# Patient Record
Sex: Female | Born: 1985 | Race: White | Hispanic: No | Marital: Married | Smoking: Never smoker
Health system: Southern US, Community
[De-identification: ages and names within clinical notes are randomized; demographics above are authoritative.]

## PROBLEM LIST (undated history)

## (undated) ENCOUNTER — Inpatient Hospital Stay (HOSPITAL_COMMUNITY): Payer: Self-pay

## (undated) DIAGNOSIS — Z789 Other specified health status: Secondary | ICD-10-CM

## (undated) HISTORY — PX: THERAPEUTIC ABORTION: SHX798

---

## 2017-02-18 ENCOUNTER — Other Ambulatory Visit: Payer: Self-pay | Admitting: Gastroenterology

## 2017-02-18 DIAGNOSIS — R1030 Lower abdominal pain, unspecified: Secondary | ICD-10-CM

## 2017-02-22 ENCOUNTER — Ambulatory Visit
Admission: RE | Admit: 2017-02-22 | Discharge: 2017-02-22 | Disposition: A | Discharge status: Home / Self Care | Payer: 59 | Source: Ambulatory Visit | Attending: Gastroenterology | Admitting: Gastroenterology

## 2017-02-22 DIAGNOSIS — R1030 Lower abdominal pain, unspecified: Secondary | ICD-10-CM

## 2017-02-22 MED ORDER — IOPAMIDOL (ISOVUE-300) INJECTION 61%
100.0000 mL | Freq: Once | INTRAVENOUS | Status: AC | PRN
  Administered 2017-02-22: 100 mL via INTRAVENOUS

## 2017-03-07 ENCOUNTER — Encounter (HOSPITAL_COMMUNITY): Payer: Self-pay | Admitting: Emergency Medicine

## 2017-03-07 ENCOUNTER — Emergency Department (HOSPITAL_COMMUNITY)
Admission: EM | Admit: 2017-03-07 | Discharge: 2017-03-08 | Disposition: A | Discharge status: Home / Self Care | Payer: 59 | Attending: Emergency Medicine | Admitting: Emergency Medicine

## 2017-03-07 DIAGNOSIS — R102 Pelvic and perineal pain: Secondary | ICD-10-CM

## 2017-03-07 LAB — COMPREHENSIVE METABOLIC PANEL
ALBUMIN: 4.5 g/dL (ref 3.5–5.0)
ALT: 15 U/L (ref 14–54)
AST: 16 U/L (ref 15–41)
Alkaline Phosphatase: 51 U/L (ref 38–126)
Anion gap: 7 (ref 5–15)
BILIRUBIN TOTAL: 1 mg/dL (ref 0.3–1.2)
BUN: 11 mg/dL (ref 6–20)
CHLORIDE: 108 mmol/L (ref 101–111)
CO2: 26 mmol/L (ref 22–32)
CREATININE: 0.8 mg/dL (ref 0.44–1.00)
Calcium: 9.6 mg/dL (ref 8.9–10.3)
GFR calc Af Amer: >60 mL/min (ref >60)
GLUCOSE: 85 mg/dL (ref 65–99)
POTASSIUM: 3.8 mmol/L (ref 3.5–5.1)
Sodium: 141 mmol/L (ref 135–145)
TOTAL PROTEIN: 7.8 g/dL (ref 6.5–8.1)

## 2017-03-07 LAB — URINALYSIS, ROUTINE W REFLEX MICROSCOPIC
BACTERIA UA: NONE SEEN
Bilirubin Urine: NEGATIVE
Glucose, UA: NEGATIVE mg/dL
Ketones, ur: NEGATIVE mg/dL
Leukocytes, UA: NEGATIVE
Nitrite: NEGATIVE
PH: 5 (ref 5.0–8.0)
Protein, ur: NEGATIVE mg/dL
SPECIFIC GRAVITY, URINE: 1.013 (ref 1.005–1.030)

## 2017-03-07 LAB — I-STAT BETA HCG BLOOD, ED (MC, WL, AP ONLY)

## 2017-03-07 LAB — CBC
HCT: 41.6 % (ref 36.0–46.0)
Hemoglobin: 13.8 g/dL (ref 12.0–15.0)
MCH: 32 pg (ref 26.0–34.0)
MCHC: 33.2 g/dL (ref 30.0–36.0)
MCV: 96.5 fL (ref 78.0–100.0)
PLATELETS: 286 10*3/uL (ref 150–400)
RBC: 4.31 MIL/uL (ref 3.87–5.11)
RDW: 12.4 % (ref 11.5–15.5)
WBC: 8.7 10*3/uL (ref 4.0–10.5)

## 2017-03-07 LAB — LIPASE, BLOOD: Lipase: 15 U/L (ref 11–51)

## 2017-03-07 MED ORDER — OXYCODONE-ACETAMINOPHEN 5-325 MG PO TABS
1.0000 | ORAL_TABLET | Freq: Once | ORAL | Status: AC
  Administered 2017-03-07: 1 via ORAL
  Filled 2017-03-07: qty 1

## 2017-03-07 MED ORDER — IBUPROFEN 200 MG PO TABS
600.0000 mg | ORAL_TABLET | Freq: Once | ORAL | Status: AC
  Administered 2017-03-07: 600 mg via ORAL
  Filled 2017-03-07: qty 3

## 2017-03-07 NOTE — ED Provider Notes (Signed)
[facility]-EMERGENCY DEPT Provider Note   CSN: [id number] Arrival date & time: 03/07/17  4132     History   Chief Complaint Chief Complaint  Patient presents with  . Pelvic Pain    HPI Kendra Romero is a 31 y.o. female.  Patient with no significant medical history presents with mild pelvic pain for the past 2-3 weeks, becoming severe yesterday. The pain is constant. She reports having an elective abortion 3 months ago that was without complication. She completed the antibiotic course she was prescribed. She denies fever, nausea, vomiting, vaginal bleeding or discharge, dysuria, diarrhea or constipation. She has not had a regular menstrual cycle since the procedure.    The history is provided by the patient. No language interpreter was used.    History reviewed. No pertinent past medical history.  There are no active problems to display for this patient.   Past Surgical History:  Procedure Laterality Date  . THERAPEUTIC ABORTION      OB History    No data available       Home Medications    Prior to Admission medications   Not on File    Family History No family history on file.  Social History Social History  Substance Use Topics  . Smoking status: Never Smoker  . Smokeless tobacco: Never Used  . Alcohol use No     Allergies   Patient has no known allergies.   Review of Systems Review of Systems  Constitutional: Negative for chills and fever.  Respiratory: Negative.   Cardiovascular: Negative.   Gastrointestinal: Negative.   Genitourinary: Positive for pelvic pain. Negative for dysuria, vaginal bleeding and vaginal discharge.  Musculoskeletal: Negative.   Skin: Negative.   Neurological: Negative.  Negative for syncope and weakness.     Physical Exam Updated Vital Signs BP 112/77 (BP Location: Left Arm)   Pulse 84   Temp 98.6 F (37 C) (Oral)   Resp 20   SpO2 100%   Physical Exam  Constitutional: She is oriented to person, place, and  time. She appears well-developed and well-nourished.  Neck: Normal range of motion.  Pulmonary/Chest: Effort normal.  Genitourinary:  Genitourinary Comments: Mucous discharge from the cervix. No bleeding. Cervix is not friable. There is bilateral uterine and adnexal tenderness without mass.   Neurological: She is alert and oriented to person, place, and time.  Skin: Skin is warm and dry.     ED Treatments / Results  Labs (all labs ordered are listed, but only abnormal results are displayed) Labs Reviewed  URINALYSIS, ROUTINE W REFLEX MICROSCOPIC - Abnormal; Notable for the following:       Result Value   Hgb urine dipstick SMALL (*)    Squamous Epithelial / LPF 0-5 (*)    All other components within normal limits  LIPASE, BLOOD  COMPREHENSIVE METABOLIC PANEL  CBC  I-STAT BETA HCG BLOOD, ED ([facility], [facility], [facility] ONLY)   Results for orders placed or performed during the hospital encounter of 03/07/17  Wet prep, genital  Result Value Ref Range   Yeast Wet Prep HPF POC NONE SEEN NONE SEEN   Trich, Wet Prep NONE SEEN NONE SEEN   Clue Cells Wet Prep HPF POC NONE SEEN NONE SEEN   WBC, Wet Prep HPF POC RARE (A) NONE SEEN   Sperm NONE SEEN   Lipase, blood  Result Value Ref Range   Lipase 15 11 - 51 U/L  Comprehensive metabolic panel  Result Value Ref Range   Sodium 141  135 - 145 mmol/L   Potassium 3.8 3.5 - 5.1 mmol/L   Chloride 108 101 - 111 mmol/L   CO2 26 22 - 32 mmol/L   Glucose, Bld 85 65 - 99 mg/dL   BUN 11 6 - 20 mg/dL   Creatinine, Ser 4.09 0.44 - 1.00 mg/dL   Calcium 9.6 8.9 - 81.1 mg/dL   Total Protein 7.8 6.5 - 8.1 g/dL   Albumin 4.5 3.5 - 5.0 g/dL   AST 16 15 - 41 U/L   ALT 15 14 - 54 U/L   Alkaline Phosphatase 51 38 - 126 U/L   Total Bilirubin 1.0 0.3 - 1.2 mg/dL   GFR calc non Af Amer >60 >60 mL/min   GFR calc Af Amer >60 >60 mL/min   Anion gap 7 5 - 15  CBC  Result Value Ref Range   WBC 8.7 4.0 - 10.5 K/uL   RBC 4.31 3.87 - 5.11 MIL/uL   Hemoglobin 13.8 12.0 -  15.0 g/dL   HCT 91.4 78.2 - 95.6 %   MCV 96.5 78.0 - 100.0 fL   MCH 32.0 26.0 - 34.0 pg   MCHC 33.2 30.0 - 36.0 g/dL   RDW 21.3 08.6 - 57.8 %   Platelets 286 150 - 400 K/uL  Urinalysis, Routine w reflex microscopic  Result Value Ref Range   Color, Urine YELLOW YELLOW   APPearance CLEAR CLEAR   Specific Gravity, Urine 1.013 1.005 - 1.030   pH 5.0 5.0 - 8.0   Glucose, UA NEGATIVE NEGATIVE mg/dL   Hgb urine dipstick SMALL (A) NEGATIVE   Bilirubin Urine NEGATIVE NEGATIVE   Ketones, ur NEGATIVE NEGATIVE mg/dL   Protein, ur NEGATIVE NEGATIVE mg/dL   Nitrite NEGATIVE NEGATIVE   Leukocytes, UA NEGATIVE NEGATIVE   RBC / HPF 0-5 0 - 5 RBC/hpf   WBC, UA 0-5 0 - 5 WBC/hpf   Bacteria, UA NONE SEEN NONE SEEN   Squamous Epithelial / LPF 0-5 (A) NONE SEEN   Mucus PRESENT   I-Stat beta hCG blood, ED  Result Value Ref Range   I-stat hCG, quantitative <5.0 <5 mIU/mL   Comment 3            EKG  EKG Interpretation None       Radiology No results found. US Pelvis Transvanginal Non-ob (tv Only)  Result Date: 03/08/2017 CLINICAL DATA:  Pelvic pain since 4 p.m.  Abortion 3 months ago. EXAM: TRANSABDOMINAL AND TRANSVAGINAL ULTRASOUND OF PELVIS DOPPLER ULTRASOUND OF OVARIES TECHNIQUE: Both transabdominal and transvaginal ultrasound examinations of the pelvis were performed. Transabdominal technique was performed for global imaging of the pelvis including uterus, ovaries, adnexal regions, and pelvic cul-de-sac. It was necessary to proceed with endovaginal exam following the transabdominal exam to visualize the ovaries and endometrium. Color and duplex Doppler ultrasound was utilized to evaluate blood flow to the ovaries. COMPARISON:  CT abdomen and pelvis 02/22/2017 FINDINGS: Uterus Measurements: 9.7 x 5.4 x 5.8 cm. No fibroids or other mass visualized. Endometrium Thickness: 10.9 mm. Diffuse thickening of the endometrium. Heterogeneous hyperechoic material throughout the endometrium and extending into  the endocervical canal. No flow is demonstrated in the endometrial contents on color flow Doppler imaging. This suggest most likely diagnosis of blood clots. Retained products of conception less likely. No focal lesions identified. Right ovary Measurements: 3.2 x 1.6 x 2.2 cm. Normal appearance/no adnexal mass. Left ovary Measurements: 3.2 x 1.6 x 2.1 cm. Normal appearance/no adnexal mass. Pulsed Doppler evaluation of both ovaries demonstrates normal  low-resistance arterial and venous waveforms. Other findings No abnormal free fluid. IMPRESSION: Diffusely thickened endometrium filled with hyperechoic material. No flow demonstrated. This most likely represents blood clots. Retained products of conception less likely. Gynecology consultation suggested. Follow-up as indicated. Electronically Signed   By: Burman NievesWilliam  Stevens M.D.   On: 03/08/2017 04:59   [place] Pelvis (transabdominal Only)  Result Date: 03/08/2017 CLINICAL DATA:  Pelvic pain since 4 p.m.  Abortion 3 months ago. EXAM: TRANSABDOMINAL AND TRANSVAGINAL ULTRASOUND OF PELVIS DOPPLER ULTRASOUND OF OVARIES TECHNIQUE: Both transabdominal and transvaginal ultrasound examinations of the pelvis were performed. Transabdominal technique was performed for global imaging of the pelvis including uterus, ovaries, adnexal regions, and pelvic cul-de-sac. It was necessary to proceed with endovaginal exam following the transabdominal exam to visualize the ovaries and endometrium. Color and duplex Doppler ultrasound was utilized to evaluate blood flow to the ovaries. COMPARISON:  CT abdomen and pelvis 02/22/2017 FINDINGS: Uterus Measurements: 9.7 x 5.4 x 5.8 cm. No fibroids or other mass visualized. Endometrium Thickness: 10.9 mm. Diffuse thickening of the endometrium. Heterogeneous hyperechoic material throughout the endometrium and extending into the endocervical canal. No flow is demonstrated in the endometrial contents on color flow Doppler imaging. This suggest most likely  diagnosis of blood clots. Retained products of conception less likely. No focal lesions identified. Right ovary Measurements: 3.2 x 1.6 x 2.2 cm. Normal appearance/no adnexal mass. Left ovary Measurements: 3.2 x 1.6 x 2.1 cm. Normal appearance/no adnexal mass. Pulsed Doppler evaluation of both ovaries demonstrates normal low-resistance arterial and venous waveforms. Other findings No abnormal free fluid. IMPRESSION: Diffusely thickened endometrium filled with hyperechoic material. No flow demonstrated. This most likely represents blood clots. Retained products of conception less likely. Gynecology consultation suggested. Follow-up as indicated. Electronically Signed   By: Burman NievesWilliam  Stevens M.D.   On: 03/08/2017 04:59   Ct Abdomen Pelvis W Contrast  Result Date: 02/22/2017 CLINICAL DATA:  31 year old female with lower abdominal and right upper quadrant pain. Diarrhea. Blood in stool. EXAM: CT ABDOMEN AND PELVIS WITH CONTRAST TECHNIQUE: Multidetector CT imaging of the abdomen and pelvis was performed using the standard protocol following bolus administration of intravenous contrast. CONTRAST:  100mL ISOVUE-300 IOPAMIDOL (ISOVUE-300) INJECTION 61% COMPARISON:  None. FINDINGS: Lower chest: No acute abnormality. Hepatobiliary: No focal liver abnormality is seen. No gallstones, gallbladder wall thickening, or biliary dilatation. Pancreas: Unremarkable. No pancreatic ductal dilatation or surrounding inflammatory changes. Spleen: Normal in size without focal abnormality. Adrenals/Urinary Tract: Adrenal glands are unremarkable. Kidneys are normal, without renal calculi, focal lesion, or hydronephrosis. Bladder is unremarkable. Stomach/Bowel: Stomach is within normal limits. Appendix appears normal. No evidence of bowel wall thickening, distention, or inflammatory changes. Vascular/Lymphatic: No significant vascular findings are present. No enlarged abdominal or pelvic lymph nodes. Reproductive: Uterus and bilateral adnexa  are unremarkable. Other: No abdominal wall hernia or abnormality. No abdominopelvic ascites. Musculoskeletal: No acute or significant osseous findings. IMPRESSION: Unremarkable CT evaluation of the abdomen and pelvis. Electronically Signed   By: Sande BrothersSerena  Chacko M.D.   On: 02/22/2017 11:41   [place] Pelvic Doppler (torsion R/o Or Mass Arterial Flow)  Result Date: 03/08/2017 CLINICAL DATA:  Pelvic pain since 4 p.m.  Abortion 3 months ago. EXAM: TRANSABDOMINAL AND TRANSVAGINAL ULTRASOUND OF PELVIS DOPPLER ULTRASOUND OF OVARIES TECHNIQUE: Both transabdominal and transvaginal ultrasound examinations of the pelvis were performed. Transabdominal technique was performed for global imaging of the pelvis including uterus, ovaries, adnexal regions, and pelvic cul-de-sac. It was necessary to proceed with endovaginal exam following the transabdominal exam to  visualize the ovaries and endometrium. Color and duplex Doppler ultrasound was utilized to evaluate blood flow to the ovaries. COMPARISON:  CT abdomen and pelvis 02/22/2017 FINDINGS: Uterus Measurements: 9.7 x 5.4 x 5.8 cm. No fibroids or other mass visualized. Endometrium Thickness: 10.9 mm. Diffuse thickening of the endometrium. Heterogeneous hyperechoic material throughout the endometrium and extending into the endocervical canal. No flow is demonstrated in the endometrial contents on color flow Doppler imaging. This suggest most likely diagnosis of blood clots. Retained products of conception less likely. No focal lesions identified. Right ovary Measurements: 3.2 x 1.6 x 2.2 cm. Normal appearance/no adnexal mass. Left ovary Measurements: 3.2 x 1.6 x 2.1 cm. Normal appearance/no adnexal mass. Pulsed Doppler evaluation of both ovaries demonstrates normal low-resistance arterial and venous waveforms. Other findings No abnormal free fluid. IMPRESSION: Diffusely thickened endometrium filled with hyperechoic material. No flow demonstrated. This most likely represents blood  clots. Retained products of conception less likely. Gynecology consultation suggested. Follow-up as indicated. Electronically Signed   By: Burman Nieves M.D.   On: 03/08/2017 04:59    Procedures Procedures (including critical care time)  Medications Ordered in ED Medications - No data to display   Initial Impression / Assessment and Plan / ED Course  I have reviewed the triage vital signs and the nursing notes.  Pertinent labs & imaging results that were available during my care of the patient were reviewed by me and considered in my medical decision making (see chart for details).     Patient presents with lower abdominal/pelvic pain ongoing for 2-3 weeks, worse yesterday - no fever.   Negative pregnancy test, no leukocytosis or anemia. Exam shows generalized tenderness through the pelvis. Pain somewhat hard to manage. US performed which shows thickened endometrium c/w blood clot, retained products less likely.   Discussed with Dr. Viviann Spare (GYN) who advised patient is fine to go home and suggested starting on Provera. Will refer for routine GYN care.   Final Clinical Impressions(s) / ED Diagnoses   Final diagnoses:  None   1. Pelvic pain   New Prescriptions New Prescriptions   No medications on file     Elpidio Anis, Cordelia Poche 03/08/17 1610    Shon Baton, MD 03/09/17 2253

## 2017-03-07 NOTE — ED Notes (Signed)
Pt reports having pelvic pain since 0400 and has worsened. Pt states she has had pink colored vaginal bleeding since then.

## 2017-03-07 NOTE — ED Triage Notes (Signed)
Pt comes in with complaints of pelvic/uterine pain.  Denies hx of ovarian cyst. Reports having an abortion 3 months ago.  States she has been taking ibuprofen without relief.  Has not had period since abortion.  States she has a clear discharge.  Denies nausea and vomiting or diarrhea.

## 2017-03-08 ENCOUNTER — Emergency Department (HOSPITAL_COMMUNITY): Payer: 59

## 2017-03-08 LAB — WET PREP, GENITAL
CLUE CELLS WET PREP: NONE SEEN
Sperm: NONE SEEN
TRICH WET PREP: NONE SEEN
Yeast Wet Prep HPF POC: NONE SEEN

## 2017-03-08 MED ORDER — HYDROCODONE-ACETAMINOPHEN 5-325 MG PO TABS: 1.0000 | ORAL_TABLET | ORAL | 0 refills | Status: DC | PRN

## 2017-03-08 MED ORDER — IBUPROFEN 600 MG PO TABS: 600.0000 mg | ORAL_TABLET | Freq: Four times a day (QID) | ORAL | 0 refills | Status: DC | PRN

## 2017-03-08 MED ORDER — OXYCODONE-ACETAMINOPHEN 5-325 MG PO TABS
1.0000 | ORAL_TABLET | Freq: Once | ORAL | Status: AC
  Administered 2017-03-08: 1 via ORAL
  Filled 2017-03-08: qty 1

## 2017-03-08 MED ORDER — MEDROXYPROGESTERONE ACETATE 5 MG PO TABS: 10.0000 mg | ORAL_TABLET | Freq: Every day | ORAL | 0 refills | Status: DC

## 2017-03-08 NOTE — ED Notes (Signed)
Ultrasound at bedside

## 2017-03-08 NOTE — ED Notes (Signed)
Awaiting ultrasound of pelvis

## 2017-03-08 NOTE — Discharge Instructions (Signed)
Take Provera once daily for the next 14 days. You will begin your period when this medication is completed within 3-4 days. You should start taking ibuprofen 600 mg 1-2 days after finishing the Provera to help with cramping pain ahead of time. [facility] for routine gynecologic care.

## 2017-03-09 LAB — GC/CHLAMYDIA PROBE AMP (~~LOC~~) NOT AT ARMC
CHLAMYDIA, DNA PROBE: NEGATIVE
NEISSERIA GONORRHEA: NEGATIVE

## 2017-05-31 ENCOUNTER — Encounter (HOSPITAL_COMMUNITY): Payer: Self-pay

## 2017-05-31 ENCOUNTER — Inpatient Hospital Stay (HOSPITAL_COMMUNITY)
Admission: AD | Admit: 2017-05-31 | Discharge: 2017-05-31 | Disposition: A | Discharge status: Home / Self Care | Payer: 59 | Source: Ambulatory Visit | Attending: Obstetrics and Gynecology | Admitting: Obstetrics and Gynecology

## 2017-05-31 DIAGNOSIS — O99281 Endocrine, nutritional and metabolic diseases complicating pregnancy, first trimester: Secondary | ICD-10-CM | POA: Insufficient documentation

## 2017-05-31 DIAGNOSIS — Z791 Long term (current) use of non-steroidal anti-inflammatories (NSAID): Secondary | ICD-10-CM | POA: Diagnosis not present

## 2017-05-31 DIAGNOSIS — Z79899 Other long term (current) drug therapy: Secondary | ICD-10-CM | POA: Diagnosis not present

## 2017-05-31 DIAGNOSIS — E860 Dehydration: Secondary | ICD-10-CM | POA: Diagnosis not present

## 2017-05-31 DIAGNOSIS — O219 Vomiting of pregnancy, unspecified: Secondary | ICD-10-CM | POA: Insufficient documentation

## 2017-05-31 DIAGNOSIS — O210 Mild hyperemesis gravidarum: Secondary | ICD-10-CM

## 2017-05-31 DIAGNOSIS — Z3A08 8 weeks gestation of pregnancy: Secondary | ICD-10-CM | POA: Diagnosis not present

## 2017-05-31 DIAGNOSIS — Z3491 Encounter for supervision of normal pregnancy, unspecified, first trimester: Secondary | ICD-10-CM

## 2017-05-31 LAB — URINALYSIS, ROUTINE W REFLEX MICROSCOPIC
BILIRUBIN URINE: NEGATIVE
Glucose, UA: NEGATIVE mg/dL
Hgb urine dipstick: NEGATIVE
KETONES UR: 80 mg/dL — AB
Nitrite: NEGATIVE
Protein, ur: 30 mg/dL — AB
Specific Gravity, Urine: 1.034 — ABNORMAL HIGH (ref 1.005–1.030)
pH: 5 (ref 5.0–8.0)

## 2017-05-31 LAB — COMPREHENSIVE METABOLIC PANEL
ALT: 20 U/L (ref 14–54)
ANION GAP: 7 (ref 5–15)
AST: 24 U/L (ref 15–41)
Albumin: 3.9 g/dL (ref 3.5–5.0)
Alkaline Phosphatase: 40 U/L (ref 38–126)
BUN: 14 mg/dL (ref 6–20)
CALCIUM: 9.4 mg/dL (ref 8.9–10.3)
CHLORIDE: 108 mmol/L (ref 101–111)
CO2: 24 mmol/L (ref 22–32)
Creatinine, Ser: 0.63 mg/dL (ref 0.44–1.00)
GFR calc non Af Amer: >60 mL/min (ref >60)
Glucose, Bld: 69 mg/dL (ref 65–99)
Potassium: 3.7 mmol/L (ref 3.5–5.1)
SODIUM: 139 mmol/L (ref 135–145)
Total Bilirubin: 0.8 mg/dL (ref 0.3–1.2)
Total Protein: 7.1 g/dL (ref 6.5–8.1)

## 2017-05-31 LAB — CBC WITH DIFFERENTIAL/PLATELET
BASOS ABS: 0 10*3/uL (ref 0.0–0.1)
BASOS PCT: 0 %
Eosinophils Absolute: 0 10*3/uL (ref 0.0–0.7)
Eosinophils Relative: 1 %
HCT: 40 % (ref 36.0–46.0)
HEMOGLOBIN: 13.7 g/dL (ref 12.0–15.0)
LYMPHS ABS: 1.8 10*3/uL (ref 0.7–4.0)
Lymphocytes Relative: 28 %
MCH: 32.4 pg (ref 26.0–34.0)
MCHC: 34.3 g/dL (ref 30.0–36.0)
MCV: 94.6 fL (ref 78.0–100.0)
MONOS PCT: 4 %
Monocytes Absolute: 0.3 10*3/uL (ref 0.1–1.0)
NEUTROS ABS: 4.3 10*3/uL (ref 1.7–7.7)
NEUTROS PCT: 67 %
Platelets: 238 10*3/uL (ref 150–400)
RBC: 4.23 MIL/uL (ref 3.87–5.11)
RDW: 12.3 % (ref 11.5–15.5)
WBC: 6.5 10*3/uL (ref 4.0–10.5)

## 2017-05-31 LAB — POCT PREGNANCY, URINE: PREG TEST UR: POSITIVE — AB

## 2017-05-31 MED ORDER — DEXTROSE 5 % IN LACTATED RINGERS IV BOLUS: 1000.0000 mL | Freq: Once | INTRAVENOUS | Status: DC

## 2017-05-31 MED ORDER — PROMETHAZINE HCL 25 MG/ML IJ SOLN
25.0000 mg | Freq: Once | INTRAVENOUS | Status: AC
  Administered 2017-05-31: 25 mg via INTRAVENOUS
  Filled 2017-05-31: qty 1

## 2017-05-31 MED ORDER — PROMETHAZINE HCL 25 MG/ML IJ SOLN: 25.0000 mg | Freq: Once | INTRAVENOUS | Status: DC

## 2017-05-31 MED ORDER — M.V.I. ADULT IV INJ
Freq: Once | INTRAVENOUS | Status: AC
  Administered 2017-05-31: 18:00:00 via INTRAVENOUS
  Filled 2017-05-31: qty 10

## 2017-05-31 NOTE — Progress Notes (Addendum)
[id number] @ early preg of 8.[redacted] wksga. Presents to triage for N/V. Has been tx previously and been taking meds for it but not working. Denies bleeding.   1700 labs and IV started.  1710: D5LR with phenergan up.   1855: Up to bathroom.   1908: relinquished care over to [employer].

## 2017-05-31 NOTE — MAU Note (Signed)
Pt reports she has not been able to keep anything down, has meds but they are not helping.

## 2017-05-31 NOTE — MAU Provider Note (Signed)
History     CSN: [id number]  Arrival date and time: 05/31/17 1546   None     Chief Complaint  Patient presents with  . Emesis   HPI Kendra Romero is 31 y.o. [id number] weeks presenting with hyperemesis.   Reports she is unable to keep anything down, hasn't eating today.  Voiding very little.  Has been treated with Diclegis-- generic does not work as well.  She is a patient of Dr. Renaldo FiddlerAdkins.    No past medical history on file.  Past Surgical History:  Procedure Laterality Date  . THERAPEUTIC ABORTION      No family history on file.  Social History   Tobacco Use  . Smoking status: Never Smoker  . Smokeless tobacco: Never Used  Substance Use Topics  . Alcohol use: No  . Drug use: No    Allergies: No Known Allergies  Medications Prior to Admission  Medication Sig Dispense Refill Last Dose  . Doxylamine-Pyridoxine (DICLEGIS) 10-10 MG TBEC Take 2 tablets by mouth at bedtime.   05/30/2017 at Unknown time  . famotidine (PEPCID) 10 MG tablet Take 10 mg by mouth 2 (two) times daily.   Past Week at Unknown time  . ibuprofen (ADVIL,MOTRIN) 600 MG tablet Take 1 tablet (600 mg total) by mouth every 6 (six) hours as needed. (Patient not taking: Reported on 05/31/2017) 30 tablet 0 Not Taking at Unknown time  . medroxyPROGESTERone (PROVERA) 5 MG tablet Take 2 tablets (10 mg total) by mouth daily. (Patient not taking: Reported on 05/31/2017) 28 tablet 0 Not Taking at Unknown time    Review of Systems  Constitutional: Positive for appetite change and fatigue. Negative for fever.  Gastrointestinal: Positive for nausea and vomiting.  Genitourinary: Positive for difficulty urinating (voiding very little). Negative for dysuria, flank pain, frequency, hematuria, vaginal bleeding and vaginal discharge.   Physical Exam   Blood pressure 118/77, pulse 86, temperature 98.4 F (36.9 C), temperature source Oral, resp. rate 17, height 5\' 6"  (1.676 m), weight 164 lb (74.4 kg), SpO2 100  %.  Physical Exam  Constitutional: She is oriented to person, place, and time. She appears well-developed and well-nourished. No distress.  Actively vomiting.   GI: There is no tenderness.  Genitourinary:  Genitourinary Comments: deferred  Neurological: She is alert and oriented to person, place, and time.  Skin: Skin is warm and dry.   Results for orders placed or performed during the hospital encounter of 05/31/17 (from the past 24 hour(s))  Urinalysis, Routine w reflex microscopic     Status: Abnormal   Collection Time: 05/31/17  3:55 PM  Result Value Ref Range   Color, Urine AMBER (A) YELLOW   APPearance HAZY (A) CLEAR   Specific Gravity, Urine 1.034 (H) 1.005 - 1.030   pH 5.0 5.0 - 8.0   Glucose, UA NEGATIVE NEGATIVE mg/dL   Hgb urine dipstick NEGATIVE NEGATIVE   Bilirubin Urine NEGATIVE NEGATIVE   Ketones, ur 80 (A) NEGATIVE mg/dL   Protein, ur 30 (A) NEGATIVE mg/dL   Nitrite NEGATIVE NEGATIVE   Leukocytes, UA TRACE (A) NEGATIVE   RBC / HPF 0-5 0 - 5 RBC/hpf   WBC, UA 0-5 0 - 5 WBC/hpf   Bacteria, UA RARE (A) NONE SEEN   Squamous Epithelial / LPF 6-30 (A) NONE SEEN   Mucus PRESENT   Pregnancy, urine POC     Status: Abnormal   Collection Time: 05/31/17  4:12 PM  Result Value Ref Range   Preg Test, Ur  POSITIVE (A) NEGATIVE  Comprehensive metabolic panel     Status: None   Collection Time: 05/31/17  5:00 PM  Result Value Ref Range   Sodium 139 135 - 145 mmol/L   Potassium 3.7 3.5 - 5.1 mmol/L   Chloride 108 101 - 111 mmol/L   CO2 24 22 - 32 mmol/L   Glucose, Bld 69 65 - 99 mg/dL   BUN 14 6 - 20 mg/dL   Creatinine, Ser 0.980.63 0.44 - 1.00 mg/dL   Calcium 9.4 8.9 - 11.910.3 mg/dL   Total Protein 7.1 6.5 - 8.1 g/dL   Albumin 3.9 3.5 - 5.0 g/dL   AST 24 15 - 41 U/L   ALT 20 14 - 54 U/L   Alkaline Phosphatase 40 38 - 126 U/L   Total Bilirubin 0.8 0.3 - 1.2 mg/dL   GFR calc non Af Amer >60 >60 mL/min   GFR calc Af Amer >60 >60 mL/min   Anion gap 7 5 - 15  CBC with  Differential/Platelet     Status: None   Collection Time: 05/31/17  5:00 PM  Result Value Ref Range   WBC 6.5 4.0 - 10.5 K/uL   RBC 4.23 3.87 - 5.11 MIL/uL   Hemoglobin 13.7 12.0 - 15.0 g/dL   HCT 14.740.0 82.936.0 - 56.246.0 %   MCV 94.6 78.0 - 100.0 fL   MCH 32.4 26.0 - 34.0 pg   MCHC 34.3 30.0 - 36.0 g/dL   RDW 13.012.3 86.511.5 - 78.415.5 %   Platelets 238 150 - 400 K/uL   Neutrophils Relative % 67 %   Neutro Abs 4.3 1.7 - 7.7 K/uL   Lymphocytes Relative 28 %   Lymphs Abs 1.8 0.7 - 4.0 K/uL   Monocytes Relative 4 %   Monocytes Absolute 0.3 0.1 - 1.0 K/uL   Eosinophils Relative 1 %   Eosinophils Absolute 0.0 0.0 - 0.7 K/uL   Basophils Relative 0 %   Basophils Absolute 0.0 0.0 - 0.1 K/uL    MAU Course  Procedures  MDM MSE Labs Exam IV hydration with D5LR with Phenergan 25mg  IV Consulted with Dr. Renaldo FiddlerAdkins.  MSE, IV hydration and labs reported to Dr. Renaldo FiddlerAdkins.  Order given to give a second bag of fluid with MVI to follow. After 1st liter of fluid with Phenergan, she was no longer vomiting.  Feeling well after 2nd liter of fluids with MVI and ready to go home. Normal lab values were reported to patient Assessment and Plan  A:  Hyperemesis at 3831w3d gestation      Dehydration   P:  IV hydration completed       She has Diclegis at home and instructed to return to using as directed by MD       Keep scheduled appt for 12/10.  To call MD if sxs worsen  Eve M Shilynn Hoch 05/31/2017, 5:47 PM

## 2017-05-31 NOTE — Discharge Instructions (Signed)
Eating Plan for Hyperemesis Gravidarum °Hyperemesis gravidarum is a severe form of morning sickness. Because this condition causes severe nausea and vomiting, it can lead to dehydration, malnutrition, and weight loss. One way to lessen the symptoms of nausea and vomiting is to follow the eating plan for hyperemesis gravidarum. It is often used along with prescribed medicines to control your symptoms. °What can I do to relieve my symptoms? °Listen to your body. Everyone is different and has different preferences. Find what works best for you. Take any of the following actions that are helpful to you: °· Eat and drink slowly. °· Eat 5-6 small meals daily instead of 3 large meals. °· Eat crackers before you get out of bed in the morning. °· Try having a snack in the middle of the night. °· Starchy foods are usually tolerated well. Examples include cereal, toast, bread, potatoes, pasta, rice, and pretzels. °· Ginger may help with nausea. Add ¼ tsp ground ginger to hot tea or choose ginger tea. °· Try drinking 100% fruit juice or an electrolyte drink. An electrolyte drink contains sodium, potassium, and chloride. °· Continue to take your prenatal vitamins as told by your health care provider. If you are having trouble taking your prenatal vitamins, talk with your health care provider about different options. °· Include at least 1 serving of protein with your meals and snacks. Protein options include meats or poultry, beans, nuts, eggs, and yogurt. Try eating a protein-rich snack before bed. Examples of these snacks include cheese and crackers or half of a peanut butter or turkey sandwich. °· Consider eliminating foods that trigger your symptoms. These may include spicy foods, coffee, high-fat foods, very sweet foods, and acidic foods. °· Try meals that have more protein combined with bland, salty, lower-fat, and dry foods, such as nuts, seeds, pretzels, crackers, and cereal. °· Talk with your healthcare provider about  starting a supplement of vitamin B6. °· Have fluids that are cold, clear, and carbonated or sour. Examples include lemonade, ginger ale, lemon-lime soda, ice water, and sparkling water. °· Try lemon or mint tea. °· Try brushing your teeth or using a mouth rinse after meals. ° °What should I avoid to reduce my symptoms? °Avoiding some of the following things may help reduce your symptoms. °· Foods with strong smells. Try eating meals in well-ventilated areas that are free of odors. °· Drinking water or other beverages with meals. Try not to drink anything during the 30 minutes before and after your meals. °· Drinking more than 1 cup of fluid at a time. Sometimes using a straw helps. °· Fried or high-fat foods, such as butter and cream sauces. °· Spicy foods. °· Skipping meals as best as you can. Nausea can be more intense on an empty stomach. If you cannot tolerate food at that time, do not force it. Try sucking on ice chips or other frozen items, and make up for missed calories later. °· Lying down within 2 hours after eating. °· Environmental triggers. These may include smoky rooms, closed spaces, rooms with strong smells, warm or humid places, overly loud and noisy rooms, and rooms with motion or flickering lights. °· Quick and sudden changes in your movement. ° °This information is not intended to replace advice given to you by your health care provider. Make sure you discuss any questions you have with your health care provider. °Document Released: 04/12/2007 Document Revised: 02/12/2016 Document Reviewed: 01/14/2016 °Elsevier Interactive Patient Education © 2018 Elsevier Inc. ° ° °Hyperemesis Gravidarum °  Hyperemesis gravidarum is a severe form of nausea and vomiting that happens during pregnancy. Hyperemesis is worse than morning sickness. It may cause you to have nausea or vomiting all day for many days. It may keep you from eating and drinking enough food and liquids. Hyperemesis usually occurs during the  first half (the first 20 weeks) of pregnancy. It often goes away once a woman is in her second half of pregnancy. However, sometimes hyperemesis continues through an entire pregnancy. °What are the causes? °The cause of this condition is not known. It may be related to changes in chemicals (hormones) in the body during pregnancy, such as the high level of pregnancy hormone (human chorionic gonadotropin) or the increase in the female sex hormone (estrogen). °What are the signs or symptoms? °Symptoms of this condition include: °· Severe nausea and vomiting. °· Nausea that does not go away. °· Vomiting that does not allow you to keep any food down. °· Weight loss. °· Body fluid loss (dehydration). °· Having no desire to eat, or not liking food that you have previously enjoyed. ° °How is this diagnosed? °This condition may be diagnosed based on: °· A physical exam. °· Your medical history. °· Your symptoms. °· Blood tests. °· Urine tests. ° °How is this treated? °This condition may be managed with medicine. If medicines to do not help relieve nausea and vomiting, you may need to receive fluids through an IV tube at the hospital. °Follow these instructions at home: °· Take over-the-counter and prescription medicines only as told by your health care provider. °· Avoid iron pills and multivitamins that contain iron for the first 3-4 months of pregnancy. If you take prescription iron pills, do not stop taking them unless your health care provider approves. °· Take the following actions to help prevent nausea and vomiting: °? In the morning, before getting out of bed, try eating a couple of dry crackers or a piece of toast. °? Avoid foods and smells that upset your stomach. Fatty and spicy foods may make nausea worse. °? Eat 5-6 small meals a day. °? Do not drink fluids while eating meals. Drink between meals. °? Eat or suck on things that have ginger in them. Ginger can help relieve nausea. °? Avoid food preparation. The  smell of food can spoil your appetite or trigger nausea. °· Follow instructions from your health care provider about eating or drinking restrictions. °· For snacks, eat high-protein foods, such as cheese. °· Keep all follow-up and pre-birth (prenatal) visits as told by your health care provider. This is important. °Contact a health care provider if: °· You have pain in your abdomen. °· You have a severe headache. °· You have vision problems. °· You are losing weight. °Get help right away if: °· You cannot drink fluids without vomiting. °· You vomit blood. °· You have constant nausea and vomiting. °· You are very weak. °· You are very thirsty. °· You feel dizzy. °· You faint. °· You have a fever or other symptoms that last for more than 2-3 days. °· You have a fever and your symptoms suddenly get worse. °Summary °· Hyperemesis gravidarum is a severe form of nausea and vomiting that happens during pregnancy. °· Making some changes to your eating habits may help relieve nausea and vomiting. °· This condition may be managed with medicine. °· If medicines to do not help relieve nausea and vomiting, you may need to receive fluids through an IV tube at the hospital. °This information   is not intended to replace advice given to you by your health care provider. Make sure you discuss any questions you have with your health care provider. °Document Released: 06/15/2005 Document Revised: 02/12/2016 Document Reviewed: 02/12/2016 °Elsevier Interactive Patient Education © 2017 Elsevier Inc. ° °

## 2017-06-12 ENCOUNTER — Encounter (HOSPITAL_COMMUNITY): Payer: Self-pay

## 2017-06-12 ENCOUNTER — Inpatient Hospital Stay (HOSPITAL_COMMUNITY)
Admission: AD | Admit: 2017-06-12 | Discharge: 2017-06-12 | Disposition: A | Discharge status: Home / Self Care | Payer: 59 | Source: Ambulatory Visit | Attending: Obstetrics and Gynecology | Admitting: Obstetrics and Gynecology

## 2017-06-12 DIAGNOSIS — K219 Gastro-esophageal reflux disease without esophagitis: Secondary | ICD-10-CM | POA: Diagnosis not present

## 2017-06-12 DIAGNOSIS — R824 Acetonuria: Secondary | ICD-10-CM

## 2017-06-12 DIAGNOSIS — O210 Mild hyperemesis gravidarum: Secondary | ICD-10-CM | POA: Diagnosis present

## 2017-06-12 DIAGNOSIS — Z3A10 10 weeks gestation of pregnancy: Secondary | ICD-10-CM | POA: Diagnosis not present

## 2017-06-12 DIAGNOSIS — O99611 Diseases of the digestive system complicating pregnancy, first trimester: Secondary | ICD-10-CM | POA: Diagnosis not present

## 2017-06-12 DIAGNOSIS — O219 Vomiting of pregnancy, unspecified: Secondary | ICD-10-CM

## 2017-06-12 LAB — URINALYSIS, ROUTINE W REFLEX MICROSCOPIC
BACTERIA UA: NONE SEEN
Bilirubin Urine: NEGATIVE
GLUCOSE, UA: NEGATIVE mg/dL
Hgb urine dipstick: NEGATIVE
KETONES UR: 80 mg/dL — AB
Leukocytes, UA: NEGATIVE
Nitrite: NEGATIVE
PROTEIN: 100 mg/dL — AB
RBC / HPF: NONE SEEN RBC/hpf (ref 0–5)
Specific Gravity, Urine: 1.033 — ABNORMAL HIGH (ref 1.005–1.030)
pH: 5 (ref 5.0–8.0)

## 2017-06-12 LAB — CBC
HEMATOCRIT: 33.6 % — AB (ref 36.0–46.0)
HEMOGLOBIN: 11.3 g/dL — AB (ref 12.0–15.0)
MCH: 32.1 pg (ref 26.0–34.0)
MCHC: 33.6 g/dL (ref 30.0–36.0)
MCV: 95.5 fL (ref 78.0–100.0)
Platelets: 200 10*3/uL (ref 150–400)
RBC: 3.52 MIL/uL — ABNORMAL LOW (ref 3.87–5.11)
RDW: 12.4 % (ref 11.5–15.5)
WBC: 4.8 10*3/uL (ref 4.0–10.5)

## 2017-06-12 LAB — COMPREHENSIVE METABOLIC PANEL
ALBUMIN: 3.3 g/dL — AB (ref 3.5–5.0)
ALK PHOS: 34 U/L — AB (ref 38–126)
ALT: 15 U/L (ref 14–54)
AST: 18 U/L (ref 15–41)
Anion gap: 8 (ref 5–15)
BILIRUBIN TOTAL: 0.5 mg/dL (ref 0.3–1.2)
BUN: 13 mg/dL (ref 6–20)
CALCIUM: 8.8 mg/dL — AB (ref 8.9–10.3)
CO2: 22 mmol/L (ref 22–32)
CREATININE: 0.67 mg/dL (ref 0.44–1.00)
Chloride: 106 mmol/L (ref 101–111)
GFR calc Af Amer: >60 mL/min (ref >60)
GFR calc non Af Amer: >60 mL/min (ref >60)
GLUCOSE: 257 mg/dL — AB (ref 65–99)
Potassium: 3.5 mmol/L (ref 3.5–5.1)
Sodium: 136 mmol/L (ref 135–145)
TOTAL PROTEIN: 6.6 g/dL (ref 6.5–8.1)

## 2017-06-12 MED ORDER — DEXTROSE 5 % IN LACTATED RINGERS IV BOLUS
1000.0000 mL | Freq: Once | INTRAVENOUS | Status: AC
  Administered 2017-06-12: 1000 mL via INTRAVENOUS

## 2017-06-12 MED ORDER — PROMETHAZINE HCL 25 MG/ML IJ SOLN
25.0000 mg | Freq: Once | INTRAMUSCULAR | Status: AC
  Administered 2017-06-12: 25 mg via INTRAVENOUS
  Filled 2017-06-12: qty 1

## 2017-06-12 MED ORDER — FAMOTIDINE IN NACL 20-0.9 MG/50ML-% IV SOLN
20.0000 mg | Freq: Once | INTRAVENOUS | Status: AC
  Administered 2017-06-12: 20 mg via INTRAVENOUS
  Filled 2017-06-12: qty 50

## 2017-06-12 MED ORDER — PROMETHAZINE HCL 25 MG PO TABS: 25.0000 mg | ORAL_TABLET | Freq: Four times a day (QID) | ORAL | 2 refills | Status: DC | PRN

## 2017-06-12 MED ORDER — LACTATED RINGERS IV BOLUS (SEPSIS)
1000.0000 mL | Freq: Once | INTRAVENOUS | Status: AC
  Administered 2017-06-12: 1000 mL via INTRAVENOUS

## 2017-06-12 MED ORDER — M.V.I. ADULT IV INJ
Freq: Once | INTRAVENOUS | Status: AC
  Administered 2017-06-12: 15:00:00 via INTRAVENOUS
  Filled 2017-06-12: qty 10

## 2017-06-12 MED ORDER — FAMOTIDINE 20 MG PO TABS: 20.0000 mg | ORAL_TABLET | Freq: Two times a day (BID) | ORAL | 2 refills | Status: DC

## 2017-06-12 NOTE — Discharge Instructions (Signed)

## 2017-06-12 NOTE — Progress Notes (Addendum)
G6P1 @ 10.[redacted] wksga. Returned to triage for cont of vomiting. Was on phenergan for nausea at home and worked [employer]. Now no longer working.   [id number]: V started and D5LR bolus infusing.   1404: provider at bs assessing.   Phenergan, pepcid, and multivitamin up.   Pt states feeling little better.   1555: new bag of LR up per provider verbal order. Awaiting to scan bag once order placed by provider.

## 2017-06-12 NOTE — MAU Provider Note (Signed)
History     CSN: [id number]  Arrival date and time: 06/12/17 1135   First Provider Initiated Contact with Patient 06/12/17 1403     Chief Complaint  Patient presents with  . Emesis   HPI Kendra Romero is a 31 y.o. [id number] at 5314w1d who presents with nausea and vomiting. This has been an ongoing issue in the pregnancy. She takes Diclegis at home with no relief. On Monday, she began vomiting again and has not been able to keep anything down the last 2 days. She denies any abdominal pain, vaginal bleeding or discharge. She also reports heartburn and spitting that started today.   OB History    Gravida Para Term Preterm AB Living   6 1     4 1    SAB TAB Ectopic Multiple Live Births     4            No past medical history on file.   Past Surgical History:  Procedure Laterality Date  . THERAPEUTIC ABORTION      No family history on file.  Social History   Tobacco Use  . Smoking status: Never Smoker  . Smokeless tobacco: Never Used  Substance Use Topics  . Alcohol use: No  . Drug use: No    Allergies: No Known Allergies  Medications Prior to Admission  Medication Sig Dispense Refill Last Dose  . Doxylamine-Pyridoxine (DICLEGIS) 10-10 MG TBEC Take 2 tablets by mouth at bedtime.   06/11/2017 at Unknown time  . famotidine (PEPCID) 10 MG tablet Take 10 mg by mouth daily.    06/11/2017 at Unknown time    Review of Systems  Constitutional: Negative.  Negative for fatigue and fever.  HENT: Negative.   Respiratory: Negative.  Negative for shortness of breath.   Cardiovascular: Negative.  Negative for chest pain.  Gastrointestinal: Positive for nausea and vomiting. Negative for abdominal pain, constipation and diarrhea.  Genitourinary: Negative.  Negative for dysuria, vaginal bleeding and vaginal discharge.  Neurological: Negative.  Negative for dizziness and headaches.   Physical Exam   Blood pressure 111/70, pulse 78, temperature 98.2 F (36.8 C), temperature  source Oral, resp. rate 15, height 5\' 6"  (1.676 m), weight 160 lb (72.6 kg), SpO2 100 %.  Physical Exam  Nursing note and vitals reviewed. Constitutional: She is oriented to person, place, and time. She appears well-developed and well-nourished. No distress.  HENT:  Head: Normocephalic.  Eyes: Pupils are equal, round, and reactive to light.  Cardiovascular: Normal rate, regular rhythm and normal heart sounds.  Respiratory: Effort normal and breath sounds normal. No respiratory distress.  GI: Soft. Bowel sounds are normal. She exhibits no distension. There is no tenderness.  Neurological: She is alert and oriented to person, place, and time.  Skin: Skin is warm and dry.  Psychiatric: She has a normal mood and affect. Her behavior is normal. Judgment and thought content normal.   FHT: 160bpm  MAU Course  Procedures Results for orders placed or performed during the hospital encounter of 06/12/17 (from the past 24 hour(s))  Urinalysis, Routine w reflex microscopic     Status: Abnormal   Collection Time: 06/12/17 12:21 PM  Result Value Ref Range   Color, Urine AMBER (A) YELLOW   APPearance HAZY (A) CLEAR   Specific Gravity, Urine 1.033 (H) 1.005 - 1.030   pH 5.0 5.0 - 8.0   Glucose, UA NEGATIVE NEGATIVE mg/dL   Hgb urine dipstick NEGATIVE NEGATIVE   Bilirubin Urine NEGATIVE NEGATIVE  Ketones, ur 80 (A) NEGATIVE mg/dL   Protein, ur 253100 (A) NEGATIVE mg/dL   Nitrite NEGATIVE NEGATIVE   Leukocytes, UA NEGATIVE NEGATIVE   RBC / HPF NONE SEEN 0 - 5 RBC/hpf   WBC, UA 0-5 0 - 5 WBC/hpf   Bacteria, UA NONE SEEN NONE SEEN   Squamous Epithelial / LPF 0-5 (A) NONE SEEN   Mucus PRESENT   Comprehensive metabolic panel     Status: Abnormal   Collection Time: 06/12/17  2:30 PM  Result Value Ref Range   Sodium 136 135 - 145 mmol/L   Potassium 3.5 3.5 - 5.1 mmol/L   Chloride 106 101 - 111 mmol/L   CO2 22 22 - 32 mmol/L   Glucose, Bld 257 (H) 65 - 99 mg/dL   BUN 13 6 - 20 mg/dL   Creatinine,  Ser 6.640.67 0.44 - 1.00 mg/dL   Calcium 8.8 (L) 8.9 - 10.3 mg/dL   Total Protein 6.6 6.5 - 8.1 g/dL   Albumin 3.3 (L) 3.5 - 5.0 g/dL   AST 18 15 - 41 U/L   ALT 15 14 - 54 U/L   Alkaline Phosphatase 34 (L) 38 - 126 U/L   Total Bilirubin 0.5 0.3 - 1.2 mg/dL   GFR calc non Af Amer >60 >60 mL/min   GFR calc Af Amer >60 >60 mL/min   Anion gap 8 5 - 15    MDM UA IV D5LR bolus Multivitamin in LR bolus LR bolus Phenergan 25mg  IV Pepcid 20mg  IVPB CBC CMP- patient received D5LR before lab drew bloodwork, glucose elevated but likely due to IV fluids Patient reports relief from nausea and GERD Reviewed with Dr. Rana SnareLowe- ok to discharge patient home Assessment and Plan   1. Nausea and vomiting during pregnancy prior to [redacted] weeks gestation   2. Ketonuria   3. Gastroesophageal reflux disease, esophagitis presence not specified    -Discharge home in stable condition -Rx for phenergan and pepcid given to patient -Patient advised to follow-up with [facility] as [facility] for [facility] care -Patient may return to MAU as needed or if her condition were to change or worsen  Rolm BookbinderCaroline M Desta Bujak CNM 06/12/2017, 3:24 PM

## 2017-06-12 NOTE — MAU Note (Signed)
Pt reports she has had vomiting throughout the preg , worsened the last few days.

## 2017-06-16 LAB — OB RESULTS CONSOLE RUBELLA ANTIBODY, IGM: Rubella: IMMUNE

## 2017-06-16 LAB — OB RESULTS CONSOLE GC/CHLAMYDIA
CHLAMYDIA, DNA PROBE: NEGATIVE
GC PROBE AMP, GENITAL: NEGATIVE

## 2017-06-16 LAB — OB RESULTS CONSOLE HEPATITIS B SURFACE ANTIGEN: Hepatitis B Surface Ag: NEGATIVE

## 2017-06-16 LAB — OB RESULTS CONSOLE HIV ANTIBODY (ROUTINE TESTING): HIV: NONREACTIVE

## 2017-06-16 LAB — OB RESULTS CONSOLE RPR: RPR: NONREACTIVE

## 2017-07-05 ENCOUNTER — Other Ambulatory Visit: Payer: Self-pay

## 2017-07-05 ENCOUNTER — Encounter (HOSPITAL_COMMUNITY): Payer: Self-pay | Admitting: *Deleted

## 2017-07-05 ENCOUNTER — Inpatient Hospital Stay (HOSPITAL_COMMUNITY)
Admission: AD | Admit: 2017-07-05 | Discharge: 2017-07-05 | Disposition: A | Discharge status: Home / Self Care | Payer: 59 | Source: Ambulatory Visit | Attending: Obstetrics and Gynecology | Admitting: Obstetrics and Gynecology

## 2017-07-05 DIAGNOSIS — O212 Late vomiting of pregnancy: Secondary | ICD-10-CM | POA: Diagnosis present

## 2017-07-05 DIAGNOSIS — O219 Vomiting of pregnancy, unspecified: Secondary | ICD-10-CM | POA: Diagnosis not present

## 2017-07-05 DIAGNOSIS — Z3A13 13 weeks gestation of pregnancy: Secondary | ICD-10-CM

## 2017-07-05 HISTORY — DX: Other specified health status: Z78.9

## 2017-07-05 LAB — COMPREHENSIVE METABOLIC PANEL
ALK PHOS: 41 U/L (ref 38–126)
ALT: 11 U/L — ABNORMAL LOW (ref 14–54)
ANION GAP: 9 (ref 5–15)
AST: 24 U/L (ref 15–41)
Albumin: 3.8 g/dL (ref 3.5–5.0)
BILIRUBIN TOTAL: 0.8 mg/dL (ref 0.3–1.2)
BUN: 11 mg/dL (ref 6–20)
CALCIUM: 9.2 mg/dL (ref 8.9–10.3)
CO2: 23 mmol/L (ref 22–32)
Chloride: 107 mmol/L (ref 101–111)
Creatinine, Ser: 0.55 mg/dL (ref 0.44–1.00)
GFR calc non Af Amer: >60 mL/min (ref >60)
Glucose, Bld: 72 mg/dL (ref 65–99)
POTASSIUM: 4.2 mmol/L (ref 3.5–5.1)
Sodium: 139 mmol/L (ref 135–145)
TOTAL PROTEIN: 7 g/dL (ref 6.5–8.1)

## 2017-07-05 LAB — CBC
HEMATOCRIT: 36.7 % (ref 36.0–46.0)
HEMOGLOBIN: 12.4 g/dL (ref 12.0–15.0)
MCH: 31.7 pg (ref 26.0–34.0)
MCHC: 33.8 g/dL (ref 30.0–36.0)
MCV: 93.9 fL (ref 78.0–100.0)
Platelets: 242 10*3/uL (ref 150–400)
RBC: 3.91 MIL/uL (ref 3.87–5.11)
RDW: 12.8 % (ref 11.5–15.5)
WBC: 6.2 10*3/uL (ref 4.0–10.5)

## 2017-07-05 LAB — URINALYSIS, ROUTINE W REFLEX MICROSCOPIC
Bilirubin Urine: NEGATIVE
GLUCOSE, UA: NEGATIVE mg/dL
HGB URINE DIPSTICK: NEGATIVE
KETONES UR: 20 mg/dL — AB
Leukocytes, UA: NEGATIVE
NITRITE: NEGATIVE
PH: 5 (ref 5.0–8.0)
Protein, ur: 30 mg/dL — AB
RBC / HPF: NONE SEEN RBC/hpf (ref 0–5)
Specific Gravity, Urine: 1.027 (ref 1.005–1.030)

## 2017-07-05 MED ORDER — FAMOTIDINE IN NACL 20-0.9 MG/50ML-% IV SOLN
20.0000 mg | Freq: Once | INTRAVENOUS | Status: AC
  Administered 2017-07-05: 20 mg via INTRAVENOUS
  Filled 2017-07-05: qty 50

## 2017-07-05 MED ORDER — LACTATED RINGERS IV BOLUS (SEPSIS)
1000.0000 mL | Freq: Once | INTRAVENOUS | Status: AC
  Administered 2017-07-05: 1000 mL via INTRAVENOUS

## 2017-07-05 MED ORDER — DEXTROSE IN LACTATED RINGERS 5 % IV SOLN
Freq: Once | INTRAVENOUS | Status: AC
  Administered 2017-07-05: 13:00:00 via INTRAVENOUS
  Filled 2017-07-05: qty 10

## 2017-07-05 MED ORDER — PROMETHAZINE HCL 25 MG/ML IJ SOLN
25.0000 mg | Freq: Once | INTRAMUSCULAR | Status: AC
  Administered 2017-07-05: 25 mg via INTRAVENOUS
  Filled 2017-07-05: qty 1

## 2017-07-05 NOTE — MAU Note (Signed)
Pt presents with c/o inability to keep anything down in the past few days.  States pain @ right mid abdomen since yesterday, hasn't taken any meds for the discomfort.Denies VB, seen January 2 secondary VB 2 weeks ago & ultrasound done with visit.

## 2017-07-05 NOTE — Discharge Instructions (Signed)
°  Diclegis instructions: Start with 2 tablets every evening. If symptoms persist, add 1 tablet every morning. If symptoms persist, add 1 tablet at mid-day.      Morning Sickness Morning sickness is when you feel sick to your stomach (nauseous) during pregnancy. This nauseous feeling may or may not come with vomiting. It often occurs in the morning but can be a problem any time of day. Morning sickness is most common during the first trimester, but it may continue throughout pregnancy. While morning sickness is unpleasant, it is usually harmless unless you develop severe and continual vomiting (hyperemesis gravidarum). This condition requires more intense treatment. What are the causes? The cause of morning sickness is not completely known but seems to be related to normal hormonal changes that occur in pregnancy. What increases the risk? You are at greater risk if you:  Experienced nausea or vomiting before your pregnancy.  Had morning sickness during a previous pregnancy.  Are pregnant with more than one baby, such as twins.  How is this treated? Do not use any medicines (prescription, over-the-counter, or herbal) for morning sickness without first talking to your health care provider. Your health care provider may prescribe or recommend:  Vitamin B6 supplements.  Anti-nausea medicines.  The herbal medicine ginger.  Follow these instructions at home:  Only take over-the-counter or prescription medicines as directed by your health care provider.  Taking multivitamins before getting pregnant can prevent or decrease the severity of morning sickness in most women.  Eat a piece of dry toast or unsalted crackers before getting out of bed in the morning.  Eat five or six small meals a day.  Eat dry and bland foods (rice, baked potato). Foods high in carbohydrates are often helpful.  Do not drink liquids with your meals. Drink liquids between meals.  Avoid greasy, fatty, and spicy  foods.  Get someone to cook for you if the smell of any food causes nausea and vomiting.  If you feel nauseous after taking prenatal vitamins, take the vitamins at night or with a snack.  Snack on protein foods (nuts, yogurt, cheese) between meals if you are hungry.  Eat unsweetened gelatins for desserts.  Wearing an acupressure wristband (worn for sea sickness) may be helpful.  Acupuncture may be helpful.  Do not smoke.  Get a humidifier to keep the air in your house free of odors.  Get plenty of fresh air. Contact a health care provider if:  Your home remedies are not working, and you need medicine.  You feel dizzy or lightheaded.  You are losing weight. Get help right away if:  You have persistent and uncontrolled nausea and vomiting.  You pass out (faint). This information is not intended to replace advice given to you by your health care provider. Make sure you discuss any questions you have with your health care provider. Document Released: 08/06/2006 Document Revised: 11/21/2015 Document Reviewed: 11/30/2012 Elsevier Interactive Patient Education  2017 [employer]

## 2017-07-05 NOTE — MAU Provider Note (Signed)
History     CSN: [id number]  Arrival date and time: 07/05/17 1027   First Provider Initiated Contact with Patient 07/05/17 1116      Chief Complaint  Patient presents with  . Emesis  . Abdominal Pain   HPI Kendra Romero is a 32 y.o. [id number] at 74w3dwho presents with n/v & flank pain. Reports n/v for the last 3+ weeks. Is taking diclegis (once per day) & phenergan as needed. Has not vomited today but reports constant nausea. States she hasn't eaten solid food since last Wednesday. Has been able to tolerate some water & ginger ale. Endorses right abdominal pain since yesterday that is constant. Rates pain 7/10 & describes as dull aching. Denies dysuria, fever/chills, hematuria, or vaginal bleeding.   OB History    Gravida Para Term Preterm AB Living   6 1     4 1    SAB TAB Ectopic Multiple Live Births     4            Past Medical History:  Diagnosis Date  . Medical history non-contributory     Past Surgical History:  Procedure Laterality Date  . THERAPEUTIC ABORTION      Family History  Problem Relation Age of Onset  . Diabetes Paternal Grandmother     Social History   Tobacco Use  . Smoking status: Never Smoker  . Smokeless tobacco: Never Used  Substance Use Topics  . Alcohol use: No  . Drug use: No    Allergies: No Known Allergies  Medications Prior to Admission  Medication Sig Dispense Refill Last Dose  . Doxylamine-Pyridoxine (DICLEGIS) 10-10 MG TBEC Take 2 tablets by mouth at bedtime.   07/04/2017 at Unknown time  . famotidine (PEPCID) 20 MG tablet Take 1 tablet (20 mg total) by mouth 2 (two) times daily. 30 tablet 2 Past Week at Unknown time  . promethazine (PHENERGAN) 25 MG tablet Take 1 tablet (25 mg total) by mouth every 6 (six) hours as needed for nausea or vomiting. 30 tablet 2 07/04/2017 at Unknown time    Review of Systems  Constitutional: Negative.   Gastrointestinal: Positive for abdominal pain, nausea and vomiting. Negative for diarrhea.   Genitourinary: Positive for frequency. Negative for dysuria, flank pain, hematuria, vaginal bleeding and vaginal discharge.   Physical Exam   Blood pressure 99/62, pulse 83, temperature 98.5 F (36.9 C), temperature source Oral, resp. rate 20, weight 161 lb 12 oz (73.4 kg), SpO2 100 %.  Physical Exam  Nursing note and vitals reviewed. Constitutional: She is oriented to person, place, and time. She appears well-developed and well-nourished. No distress.  HENT:  Head: Normocephalic and atraumatic.  Eyes: Conjunctivae are normal. Right eye exhibits no discharge. Left eye exhibits no discharge. No scleral icterus.  Neck: Normal range of motion.  Cardiovascular: Normal rate, regular rhythm and normal heart sounds.  No murmur heard. Respiratory: Effort normal and breath sounds normal. No respiratory distress. She has no wheezes.  GI: Soft. Bowel sounds are normal. She exhibits no distension. There is no tenderness.  Neurological: She is alert and oriented to person, place, and time.  Skin: Skin is warm and dry. She is not diaphoretic.  Psychiatric: She has a normal mood and affect. Her behavior is normal. Judgment and thought content normal.    MAU Course  Procedures Results for orders placed or performed during the hospital encounter of 07/05/17 (from the past 48 hour(s))  Urinalysis, Routine w reflex microscopic     Status:  Abnormal   Collection Time: 07/05/17 10:35 AM  Result Value Ref Range   Color, Urine AMBER (A) YELLOW    Comment: BIOCHEMICALS MAY BE AFFECTED BY COLOR   APPearance HAZY (A) CLEAR   Specific Gravity, Urine 1.027 1.005 - 1.030   pH 5.0 5.0 - 8.0   Glucose, UA NEGATIVE NEGATIVE mg/dL   Hgb urine dipstick NEGATIVE NEGATIVE   Bilirubin Urine NEGATIVE NEGATIVE   Ketones, ur 20 (A) NEGATIVE mg/dL   Protein, ur 30 (A) NEGATIVE mg/dL   Nitrite NEGATIVE NEGATIVE   Leukocytes, UA NEGATIVE NEGATIVE   RBC / HPF NONE SEEN 0 - 5 RBC/hpf   WBC, UA 0-5 0 - 5 WBC/hpf    Bacteria, UA RARE (A) NONE SEEN   Squamous Epithelial / LPF 6-30 (A) NONE SEEN   Mucus PRESENT   CBC     Status: None   Collection Time: 07/05/17 11:40 AM  Result Value Ref Range   WBC 6.2 4.0 - 10.5 K/uL   RBC 3.91 3.87 - 5.11 MIL/uL   Hemoglobin 12.4 12.0 - 15.0 g/dL   HCT 36.7 36.0 - 46.0 %   MCV 93.9 78.0 - 100.0 fL   MCH 31.7 26.0 - 34.0 pg   MCHC 33.8 30.0 - 36.0 g/dL   RDW 12.8 11.5 - 15.5 %   Platelets 242 150 - 400 K/uL  Comprehensive metabolic panel     Status: Abnormal   Collection Time: 07/05/17 11:40 AM  Result Value Ref Range   Sodium 139 135 - 145 mmol/L   Potassium 4.2 3.5 - 5.1 mmol/L   Chloride 107 101 - 111 mmol/L   CO2 23 22 - 32 mmol/L   Glucose, Bld 72 65 - 99 mg/dL   BUN 11 6 - 20 mg/dL   Creatinine, Ser 0.55 0.44 - 1.00 mg/dL   Calcium 9.2 8.9 - 10.3 mg/dL   Total Protein 7.0 6.5 - 8.1 g/dL   Albumin 3.8 3.5 - 5.0 g/dL   AST 24 15 - 41 U/L   ALT 11 (L) 14 - 54 U/L   Alkaline Phosphatase 41 38 - 126 U/L   Total Bilirubin 0.8 0.3 - 1.2 mg/dL   GFR calc non Af Amer >60 >60 mL/min   GFR calc Af Amer >60 >60 mL/min    Comment: (NOTE) The eGFR has been calculated using the CKD EPI equation. This calculation has not been validated in all clinical situations. eGFR's persistently <60 mL/min signify possible Chronic Kidney Disease.    Anion gap 9 5 - 15     MDM FHT 144 IV fluids -- LR followed by MVI in D5LR Phenergan 20 mg  & pepcid 20 mg IV No vomiting in MAU & pt able to tolerate po -- requesting to be discharged home  C/w Dr. Matthew Saras. Ok to discharge home.   Pt declines additional Rx for antiemetic. Discussed increasing doses of diclegis Assessment and Plan  A: 1. Nausea and vomiting during pregnancy prior to [redacted] weeks gestation   2. [redacted] weeks gestation of pregnancy    P: Discharge home Take additional doses of diclegis prn Discussed reasons to return to MAU F/u with OB  Jorje Guild 07/05/2017, 11:16 AM

## 2017-07-05 NOTE — MAU Note (Signed)
Hasn't been able to eat, can't keep anything down.  Is peeing more though. Pain mid right side, started yesterday. no diarrhea, can't remember when last had a bm

## 2018-01-06 ENCOUNTER — Encounter (HOSPITAL_COMMUNITY): Payer: Self-pay | Admitting: *Deleted

## 2018-01-06 ENCOUNTER — Inpatient Hospital Stay (HOSPITAL_COMMUNITY): Payer: 59 | Admitting: Anesthesiology

## 2018-01-06 ENCOUNTER — Inpatient Hospital Stay (HOSPITAL_COMMUNITY)
Admission: AD | Admit: 2018-01-06 | Discharge: 2018-01-07 | DRG: 807 | Disposition: A | Discharge status: Home / Self Care | Payer: 59 | Source: Ambulatory Visit | Attending: Obstetrics and Gynecology | Admitting: Obstetrics and Gynecology

## 2018-01-06 ENCOUNTER — Other Ambulatory Visit: Payer: Self-pay

## 2018-01-06 DIAGNOSIS — Z3A39 39 weeks gestation of pregnancy: Secondary | ICD-10-CM

## 2018-01-06 DIAGNOSIS — Z3403 Encounter for supervision of normal first pregnancy, third trimester: Secondary | ICD-10-CM | POA: Diagnosis present

## 2018-01-06 LAB — OB RESULTS CONSOLE GBS: STREP GROUP B AG: NEGATIVE

## 2018-01-06 LAB — CBC
HEMATOCRIT: 34.6 % — AB (ref 36.0–46.0)
HEMOGLOBIN: 11.7 g/dL — AB (ref 12.0–15.0)
MCH: 32.1 pg (ref 26.0–34.0)
MCHC: 33.8 g/dL (ref 30.0–36.0)
MCV: 94.8 fL (ref 78.0–100.0)
PLATELETS: 235 10*3/uL (ref 150–400)
RBC: 3.65 MIL/uL — AB (ref 3.87–5.11)
RDW: 13.4 % (ref 11.5–15.5)
WBC: 6.3 10*3/uL (ref 4.0–10.5)

## 2018-01-06 LAB — TYPE AND SCREEN
ABO/RH(D): O POS
Antibody Screen: NEGATIVE

## 2018-01-06 LAB — RPR: RPR Ser Ql: NONREACTIVE

## 2018-01-06 MED ORDER — ONDANSETRON HCL 4 MG/2ML IJ SOLN: 4.0000 mg | Freq: Four times a day (QID) | INTRAMUSCULAR | Status: DC | PRN

## 2018-01-06 MED ORDER — ACETAMINOPHEN 325 MG PO TABS: 650.0000 mg | ORAL_TABLET | ORAL | Status: DC | PRN

## 2018-01-06 MED ORDER — DIPHENHYDRAMINE HCL 50 MG/ML IJ SOLN: 12.5000 mg | INTRAMUSCULAR | Status: DC | PRN

## 2018-01-06 MED ORDER — WITCH HAZEL-GLYCERIN EX PADS: 1.0000 "application " | MEDICATED_PAD | CUTANEOUS | Status: DC | PRN

## 2018-01-06 MED ORDER — LIDOCAINE HCL (PF) 1 % IJ SOLN
30.0000 mL | INTRAMUSCULAR | Status: DC | PRN
  Filled 2018-01-06: qty 30

## 2018-01-06 MED ORDER — OXYTOCIN BOLUS FROM INFUSION
500.0000 mL | Freq: Once | INTRAVENOUS | Status: AC
  Administered 2018-01-06: 500 mL via INTRAVENOUS

## 2018-01-06 MED ORDER — MEASLES, MUMPS & RUBELLA VAC ~~LOC~~ INJ
0.5000 mL | INJECTION | Freq: Once | SUBCUTANEOUS | Status: DC
  Filled 2018-01-06: qty 0.5

## 2018-01-06 MED ORDER — LACTATED RINGERS IV SOLN
INTRAVENOUS | Status: DC
  Administered 2018-01-06: 05:00:00 via INTRAVENOUS

## 2018-01-06 MED ORDER — FLEET ENEMA 7-19 GM/118ML RE ENEM: 1.0000 | ENEMA | RECTAL | Status: DC | PRN

## 2018-01-06 MED ORDER — OXYCODONE-ACETAMINOPHEN 5-325 MG PO TABS: 1.0000 | ORAL_TABLET | ORAL | Status: DC | PRN

## 2018-01-06 MED ORDER — COCONUT OIL OIL
1.0000 "application " | TOPICAL_OIL | Status: DC | PRN
  Administered 2018-01-06: 1 via TOPICAL
  Filled 2018-01-06: qty 120

## 2018-01-06 MED ORDER — BENZOCAINE-MENTHOL 20-0.5 % EX AERO
1.0000 "application " | INHALATION_SPRAY | CUTANEOUS | Status: DC | PRN
  Administered 2018-01-06: 1 via TOPICAL
  Filled 2018-01-06: qty 56

## 2018-01-06 MED ORDER — PHENYLEPHRINE 40 MCG/ML (10ML) SYRINGE FOR IV PUSH (FOR BLOOD PRESSURE SUPPORT)
80.0000 ug | PREFILLED_SYRINGE | INTRAVENOUS | Status: DC | PRN
  Filled 2018-01-06: qty 5

## 2018-01-06 MED ORDER — LIDOCAINE HCL (PF) 1 % IJ SOLN
INTRAMUSCULAR | Status: DC | PRN
  Administered 2018-01-06: 13 mL via EPIDURAL

## 2018-01-06 MED ORDER — SIMETHICONE 80 MG PO CHEW: 80.0000 mg | CHEWABLE_TABLET | ORAL | Status: DC | PRN

## 2018-01-06 MED ORDER — IBUPROFEN 600 MG PO TABS
600.0000 mg | ORAL_TABLET | Freq: Four times a day (QID) | ORAL | Status: DC
  Administered 2018-01-06 – 2018-01-07 (×6): 600 mg via ORAL
  Filled 2018-01-06 (×6): qty 1

## 2018-01-06 MED ORDER — DIPHENHYDRAMINE HCL 25 MG PO CAPS: 25.0000 mg | ORAL_CAPSULE | Freq: Four times a day (QID) | ORAL | Status: DC | PRN

## 2018-01-06 MED ORDER — DIBUCAINE 1 % RE OINT: 1.0000 "application " | TOPICAL_OINTMENT | RECTAL | Status: DC | PRN

## 2018-01-06 MED ORDER — EPHEDRINE 5 MG/ML INJ
10.0000 mg | INTRAVENOUS | Status: DC | PRN
  Filled 2018-01-06: qty 2

## 2018-01-06 MED ORDER — OXYTOCIN 40 UNITS IN LACTATED RINGERS INFUSION - SIMPLE MED
2.5000 [IU]/h | INTRAVENOUS | Status: DC
  Filled 2018-01-06: qty 1000

## 2018-01-06 MED ORDER — OXYCODONE-ACETAMINOPHEN 5-325 MG PO TABS: 2.0000 | ORAL_TABLET | ORAL | Status: DC | PRN

## 2018-01-06 MED ORDER — SENNOSIDES-DOCUSATE SODIUM 8.6-50 MG PO TABS
2.0000 | ORAL_TABLET | ORAL | Status: DC
  Administered 2018-01-07: 2 via ORAL
  Filled 2018-01-06: qty 2

## 2018-01-06 MED ORDER — LACTATED RINGERS IV SOLN
500.0000 mL | INTRAVENOUS | Status: DC | PRN
  Administered 2018-01-06: 500 mL via INTRAVENOUS

## 2018-01-06 MED ORDER — LACTATED RINGERS IV SOLN
500.0000 mL | Freq: Once | INTRAVENOUS | Status: AC
  Administered 2018-01-06: 500 mL via INTRAVENOUS

## 2018-01-06 MED ORDER — ONDANSETRON HCL 4 MG PO TABS: 4.0000 mg | ORAL_TABLET | ORAL | Status: DC | PRN

## 2018-01-06 MED ORDER — BUTORPHANOL TARTRATE 1 MG/ML IJ SOLN
INTRAMUSCULAR | Status: AC
  Filled 2018-01-06: qty 1

## 2018-01-06 MED ORDER — PHENYLEPHRINE 40 MCG/ML (10ML) SYRINGE FOR IV PUSH (FOR BLOOD PRESSURE SUPPORT)
80.0000 ug | PREFILLED_SYRINGE | INTRAVENOUS | Status: DC | PRN
  Filled 2018-01-06: qty 5
  Filled 2018-01-06: qty 10

## 2018-01-06 MED ORDER — FENTANYL 2.5 MCG/ML BUPIVACAINE 1/10 % EPIDURAL INFUSION (WH - ANES)
14.0000 mL/h | INTRAMUSCULAR | Status: DC | PRN
  Administered 2018-01-06: 14 mL/h via EPIDURAL
  Filled 2018-01-06: qty 100

## 2018-01-06 MED ORDER — BUTORPHANOL TARTRATE 1 MG/ML IJ SOLN: 1.0000 mg | INTRAMUSCULAR | Status: DC

## 2018-01-06 MED ORDER — ONDANSETRON HCL 4 MG/2ML IJ SOLN: 4.0000 mg | INTRAMUSCULAR | Status: DC | PRN

## 2018-01-06 MED ORDER — MEDROXYPROGESTERONE ACETATE 150 MG/ML IM SUSP: 150.0000 mg | INTRAMUSCULAR | Status: DC | PRN

## 2018-01-06 MED ORDER — PRENATAL MULTIVITAMIN CH
1.0000 | ORAL_TABLET | Freq: Every day | ORAL | Status: DC
  Administered 2018-01-06 – 2018-01-07 (×2): 1 via ORAL
  Filled 2018-01-06 (×2): qty 1

## 2018-01-06 MED ORDER — TETANUS-DIPHTH-ACELL PERTUSSIS 5-2.5-18.5 LF-MCG/0.5 IM SUSP: 0.5000 mL | Freq: Once | INTRAMUSCULAR | Status: DC

## 2018-01-06 MED ORDER — SOD CITRATE-CITRIC ACID 500-334 MG/5ML PO SOLN: 30.0000 mL | ORAL | Status: DC | PRN

## 2018-01-06 NOTE — Progress Notes (Signed)
SVD of vigorous female infant w/ apgars of 9,9.  Placenta delivered spontaneous w/ 3VC.   Right labial lac repaired w/ 3-0 vicryl rapide.  Fundus firm.  EBL 200cc .

## 2018-01-06 NOTE — Anesthesia Procedure Notes (Signed)
Epidural Patient location during procedure: OB Start time: 01/06/2018 3:58 AM End time: 01/06/2018 4:10 AM  Staffing Anesthesiologist: Lowella CurbMiller, Clementina Mareno Ray, MD Performed: anesthesiologist   Preanesthetic Checklist Completed: patient identified, site marked, surgical consent, pre-op evaluation, timeout performed, IV checked, risks and benefits discussed and monitors and equipment checked  Epidural Patient position: sitting Prep: ChloraPrep Patient monitoring: heart rate, cardiac monitor, continuous pulse ox and blood pressure Approach: midline Location: L2-L3 Injection technique: LOR saline  Needle:  Needle type: Tuohy  Needle gauge: 17 G Needle length: 9 cm Needle insertion depth: 5 cm Catheter type: closed end flexible Catheter size: 20 Guage Catheter at skin depth: 9 cm Test dose: negative  Assessment Events: blood not aspirated, injection not painful, no injection resistance, negative IV test and no paresthesia  Additional Notes Reason for block:procedure for pain

## 2018-01-06 NOTE — Lactation Note (Signed)
This note was copied from a baby's chart. Lactation Consultation Note  Patient Name: Kendra Romero UXLKG'MToday's Date: 01/06/2018 Reason for consult: Initial assessment;Term  Baby is 10 hours old  As LC entered the room, stirring in the crib, LC checked diaper,  Wet changed and placed baby STS on the right breast / showed  Mom how to hand express/ glistening noted/ areola compressible.  LC assisted mom to latch in the football, baby latched , noted to lip feed  More more than jaw feed and would not sustain a latch for very long and release.  LC assisted to switch to the cross cradle position/ at 1st mom was trying cradle  And LC discussed why its so important to help the baby learn to obtain the depth  At the breast and not nibble her way onto the breast.  Baby STS after attempt. Colostrum collectors left with mom.  Per mom has  Spectra 2 at home.  Mother informed of post-discharge support and given phone number to the lactation department, including services for phone call assistance; out-patient appointments; and breastfeeding support group. List of other breastfeeding resources in the community given in the handout. Encouraged mother to call for problems or concerns related to breastfeeding.  Pacifier from home noted in the crib and LC discussed why its so important for the baby to learn her 1st.   Maternal Data Has patient been taught Hand Expression?: Yes Does the patient have breastfeeding experience prior to this delivery?: Yes  Feeding Feeding Type: Breast Fed(switched to the cross cradle ) Length of feed: (few sucks , no swallows )  LATCH Score Latch: Repeated attempts needed to sustain latch, nipple held in mouth throughout feeding, stimulation needed to elicit sucking reflex.  Audible Swallowing: None  Type of Nipple: Everted at rest and after stimulation  Comfort (Breast/Nipple): Soft / non-tender  Hold (Positioning): Assistance needed to correctly position infant  at breast and maintain latch.  LATCH Score: 6  Interventions Interventions: Breast feeding basics reviewed;Assisted with latch;Skin to skin;Breast massage;Breast compression;Adjust position;Support pillows;Position options  Lactation Tools Discussed/Used     Consult Status Consult Status: Follow-up Date: 01/07/18 Follow-up type: In-patient    [name] 01/06/2018, 3:59 PM

## 2018-01-06 NOTE — Anesthesia Preprocedure Evaluation (Addendum)

## 2018-01-06 NOTE — H&P (Signed)
Kendra Romero is a 32 y.o. female presenting for SOL.  Regular ctx.  No vb or lof. Pregnancy uncomplicated.  OB History    Gravida  6   Para  1   Term      Preterm      AB  4   Living  1     SAB      TAB  4   Ectopic      Multiple      Live Births             Past Medical History:  Diagnosis Date  . Medical history non-contributory    Past Surgical History:  Procedure Laterality Date  . THERAPEUTIC ABORTION     Family History: family history includes Diabetes in her paternal grandmother. Social History:  reports that she has never smoked. She has never used smokeless tobacco. She reports that she does not drink alcohol or use drugs.     Maternal Diabetes: No Genetic Screening: Normal Maternal Ultrasounds/Referrals: Normal Fetal Ultrasounds or other Referrals:  None Maternal Substance Abuse:  No Significant Maternal Medications:  None Significant Maternal Lab Results:  None Other Comments:  None  ROS History Dilation: 10 Effacement (%): 100 Station: 0 Exam by:: Ottavio Norem Blood pressure 114/78, pulse 82, temperature 98.4 F (36.9 C), temperature source Oral, resp. rate 16, height 5\' 7"  (1.702 m), weight 183 lb (83 kg), SpO2 97 %. Exam Physical Exam  Gen - comfortable w/ epidural Abd - gravid, NT Ext - NT, no edema Cvx c/c/+1 AROM - clear Prenatal labs: ABO, Rh: --/--/O POS (07/11 0324) Antibody: NEG (07/11 0324) Rubella: Immune (12/19 0000) RPR: Nonreactive (12/19 0000)  HBsAg: Negative (12/19 0000)  HIV: Non-reactive (12/19 0000)  GBS: Negative (07/11 0000)   Assessment/Plan: Exp mngt   Kendra Romero 01/06/2018, 5:21 AM

## 2018-01-06 NOTE — MAU Note (Signed)
Pt presents to MAU c/o ctx every 2-64min. No bleeding or LOF. +FM.

## 2018-01-06 NOTE — Anesthesia Postprocedure Evaluation (Signed)
Anesthesia Post Note  Patient: Kendra Romero  Procedure(s) Performed: AN AD HOC LABOR EPIDURAL     Patient location during evaluation: Mother Baby Anesthesia Type: Epidural Level of consciousness: awake Pain management: pain level controlled Vital Signs Assessment: post-procedure vital signs reviewed and stable Respiratory status: spontaneous breathing Cardiovascular status: stable Postop Assessment: no headache, no backache, epidural receding, patient able to bend at knees, no apparent nausea or vomiting, adequate PO intake and able to ambulate Anesthetic complications: no    Last Vitals:  Vitals:   01/06/18 0940 01/06/18 1340  BP: 92/74 94/64  Pulse: 70 63  Resp: 16 16  Temp: 36.6 C (!) 36.4 C  SpO2: 100% 99%    Last Pain:  Vitals:   01/06/18 1340  TempSrc: Oral  PainSc:    Pain Goal: Patients Stated Pain Goal: 0 (01/06/18 0253)               Fanny DanceMULLINS,Shayra Anton

## 2018-01-07 LAB — CBC
HEMATOCRIT: 29.3 % — AB (ref 36.0–46.0)
HEMOGLOBIN: 9.9 g/dL — AB (ref 12.0–15.0)
MCH: 32.9 pg (ref 26.0–34.0)
MCHC: 33.8 g/dL (ref 30.0–36.0)
MCV: 97.3 fL (ref 78.0–100.0)
Platelets: 217 10*3/uL (ref 150–400)
RBC: 3.01 MIL/uL — ABNORMAL LOW (ref 3.87–5.11)
RDW: 13.6 % (ref 11.5–15.5)
WBC: 8.9 10*3/uL (ref 4.0–10.5)

## 2018-01-07 NOTE — Discharge Summary (Signed)
Obstetric Discharge Summary Reason for Admission: onset of labor Prenatal Procedures: none Intrapartum Procedures: spontaneous vaginal delivery Postpartum Procedures: none Complications-Operative and Postpartum: none Hemoglobin  Date Value Ref Range Status  01/07/2018 9.9 (L) 12.0 - 15.0 g/dL Final   HCT  Date Value Ref Range Status  01/07/2018 29.3 (L) 36.0 - 46.0 % Final    Physical Exam:  General: alert, cooperative and appears stated age 61Lochia: appropriate Uterine Fundus: firm Incision: healing well, no significant drainage, no dehiscence, no significant erythema DVT Evaluation: No evidence of DVT seen on physical exam. Negative Homan's sign. No cords or calf tenderness. No significant calf/ankle edema.  Discharge Diagnoses: Term Pregnancy-delivered  Discharge Information: Date: 01/07/2018 Activity: pelvic rest Diet: routine Medications: None Condition: stable Instructions: refer to practice specific booklet Discharge to: home   Newborn Data: Live born female  Birth Weight: 7 lb 0.2 oz (3181 g) APGAR: 9, 9  Newborn Delivery   Birth date/time:  01/06/2017 05:35:00 Delivery type:  Vaginal, Spontaneous     Home with mother.  Kendra Romero 01/07/2018, 12:06 PM

## 2018-01-08 ENCOUNTER — Ambulatory Visit: Payer: Self-pay

## 2018-01-08 NOTE — Lactation Note (Addendum)
This note was copied from a baby's chart. Lactation Consultation Note Baby 6843 hrs old on DPT. Mom holding baby resting. Mom states she's tired, baby has been cluster feeding.  Breast are filling. Small short shaft nipples are sore. Mom hand expressed colostrum easily. Breast are filling, praised mom and encouraged giving baby colostrum after BF and breast massage during feeding to increase transfer. Baby had 7% weight loss at 19 hrs old. Mom turned her head and closed her eyes at this point. MD not concerned d/t excessive output per MD note. FOB stated MD said wt. Loss normal. Mom stated nothing mentioned about wt. Loss. Mom has used DEBP, stating she didn't collect any colostrum. Asked mom if she could pump and give baby back colostrum. FOB stated she wouldn't be pumping tonight because baby is on the breast so much.  Mom resting w/eyes closed, stopped answering LC and no further eye contact. Mom did answer question from FOB. Encouraged to call for assistance or questions.  LC concerned mom's milk coming in w/short shaft baby may not transfer as much causing increased engorgement. Comfort gels left w/fob.   Patient Name: Kendra Romero QMVHQ'IToday's Date: 01/08/2018 Reason for consult: Follow-up assessment;Hyperbilirubinemia;Infant weight loss   Maternal Data    Feeding Feeding Type: Breast Fed Length of feed: 10 min  LATCH Score       Type of Nipple: Everted at rest and after stimulation(short shaft)  Comfort (Breast/Nipple): Filling, red/small blisters or bruises, mild/mod discomfort        Interventions    Lactation Tools Discussed/Used     Consult Status Consult Status: Follow-up Date: 01/08/18 Follow-up type: In-patient    [name], Diamond NickelLAURA G 01/08/2018, 1:16 AM

## 2018-01-08 NOTE — Lactation Note (Signed)
This note was copied from a baby's chart. Lactation Consultation Note  Patient Name: Kendra Baker JanusKatherine Khurana EAVWU'JToday's Date: 01/08/2018   Lactation visit attempted, but Mom trying to sleep. Infant is sleeping in bassinet. Mom has my # to call when ready for consult.  Kendra Romero, [facility] Romero [facility] 01/08/2018, 9:23 AM

## 2018-01-08 NOTE — Lactation Note (Signed)
This note was copied from a baby's chart. Lactation Consultation Note  Patient Name: Kendra Romero JanusKatherine Gonzales RUEAV'WToday's Date: 01/08/2018   Mom is too sore to put "Kendra Romero" to the breast, but she is now pumping q3hrs (and getting 7-8 mLs/pumping session). She also has & is wearing Comfort Gels. Now, the size 27 flanges are more comfortable for Mom with pumping.  Infant continues to cup feed with ease. I spoke w/Mom about trying not to introduce a bottle nipple until infant has learned how to latch properly/transfer milk. Mom is comfortable using a cup and is able to do so independently.   Glenetta HewKim Dakhari Zuver, RN, IBCLC

## 2018-01-08 NOTE — Lactation Note (Signed)
This note was copied from a baby's chart. Lactation Consultation Note  Patient Name: Kendra Romero UXLKG'MToday's Date: 01/08/2018 Reason for consult: Follow-up assessment  I have worked with this dyad multiple times this morning. I observed infant at breast & she was either not interested in latching or she was unable to get a deep enough latch to allow her to transfer milk. Infant was noted to be jaundiced, so I asked RN for an additional skin bili.   Mom's breasts are filling, but her nipples are sore & her nipple was pinched when infant released latch. A compression stripe was noted on R nipple. Mom was shown how to use DEBP & how to disassemble, clean, & reassemble parts. Size 24 flanges are appropriate for her at this time.   Infant was noted to be spitty &  then had a good-sized emesis (clear, mucous) at 1128. Soon thereafter, infant was not showing a disinterest in being fed (as it had appeared earlier). The infant fed amazingly well with a cup (with EBM & formula [Dr. Karilyn CotaGosrani had discussed possibility of formula use with patient prior to this]).   Since infant is now showing a renewed interest in feeding & excellent tongue mobility with cup feeding, we may attempt to put infant to breast again at next feeding.   [name], [name] [facility] 01/08/2018, 11:52 AM

## 2018-01-09 ENCOUNTER — Ambulatory Visit: Payer: Self-pay

## 2018-01-09 NOTE — Lactation Note (Signed)
This note was copied from a baby's chart. Lactation Consultation Note  Patient Name: Kendra Romero XBJYN'WToday's Date: 01/09/2018 Reason for consult: Follow-up assessment  P2 Mom with term baby at 1675 hrs old.  Mom in tears this morning and "wants to just get home", and that is why she switched to formula by bottle.  Baby gained to 6% weight loss (from 8% yesterday).  Compassionate care given, and listened to Mom.  Mom has a DEBP at home, encouraged to pump when baby eats if she still would like to keep option open for breastfeeding.    Mom interested in OP lactation appointment, message sent to clinic.  Engorgement prevention and treatment discussed.  Encouraged STS, and feeding baby often on cue.  If baby is bottle fed, she can pump both breasts for 15-20 mins, and use breast massage and hand expression.  OP lactation services discussed.  Encouraged Mom to call prn.  Consult Status Consult Status: Follow-up Date: 01/12/18 Follow-up type: Out-patient    Judee ClaraSmith, Jerline Linzy E 01/09/2018, 9:11 AM

## 2018-01-13 ENCOUNTER — Inpatient Hospital Stay (HOSPITAL_COMMUNITY)
Admission: RE | Admit: 2018-01-13 | Discharge: 2018-01-13 | Disposition: A | Discharge status: Home / Self Care | Payer: 59 | Source: Ambulatory Visit | Attending: Obstetrics & Gynecology | Admitting: Obstetrics & Gynecology
